# Patient Record
Sex: Male | Born: 1995 | Race: Black or African American | Hispanic: No | Marital: Single | Smoking: Never smoker
Health system: Southern US, Community
[De-identification: ages and names within clinical notes are randomized; demographics above are authoritative.]

## PROBLEM LIST (undated history)

## (undated) DIAGNOSIS — J45909 Unspecified asthma, uncomplicated: Secondary | ICD-10-CM

## (undated) HISTORY — PX: OTHER SURGICAL HISTORY: SHX169

## (undated) HISTORY — PX: TONSILLECTOMY AND ADENOIDECTOMY: SHX28

---
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
## 2015-09-29 ENCOUNTER — Encounter: Payer: Self-pay | Admitting: Internal Medicine

## 2015-09-29 ENCOUNTER — Ambulatory Visit (INDEPENDENT_AMBULATORY_CARE_PROVIDER_SITE_OTHER): Payer: Self-pay | Admitting: Internal Medicine

## 2015-09-29 VITALS — BP 126/62 | HR 57 | Temp 98.2°F | Ht 69.4 in | Wt 171.9 lb

## 2015-09-29 DIAGNOSIS — Z8709 Personal history of other diseases of the respiratory system: Secondary | ICD-10-CM

## 2015-09-29 DIAGNOSIS — Z0000 Encounter for general adult medical examination without abnormal findings: Secondary | ICD-10-CM | POA: Insufficient documentation

## 2015-09-29 NOTE — Patient Instructions (Signed)
Nice meeting you.  I am glad you are living a healthy life.  Continue to eat healthy, be active, and be safe.   Follow every year for check up.

## 2015-09-29 NOTE — Progress Notes (Signed)
   Subjective:    Patient ID: Taylor Estes, male    DOB: March 11, 1996, 20 y.o.   MRN: [id number]  HPI  20 yo male with hx of childhood asthma, no other medical history presents to get a physical for college.   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 Plans to study computer science. Denies any symptoms currently. Denies any depressive mood.   PMH: Denies any medical problems in the past.  Past surgical hx: Had a meniscus tear on right knee 2 years ago, and T&A removal when he was 664-20 years old.  Social hx: does not smoke, does not drink, no drug use. Single, not sexually active.  Family hx: mother has diabetes, no heart disease, no HTN, no cancer.  Patient brought immunization paper to be filled out but did not bring any records. Asked him to try to see if his ped office or his parents would have the records.   Review of Systems  Constitutional: Negative for fever, chills and fatigue.  HENT: Negative for congestion, rhinorrhea and sore throat.   Eyes: Negative for photophobia and visual disturbance.  Respiratory: Negative for chest tightness, shortness of breath and wheezing.   Cardiovascular: Negative for chest pain, palpitations and leg swelling.  Gastrointestinal: Negative for abdominal pain and abdominal distention.  Genitourinary: Negative for dysuria and hematuria.  Musculoskeletal: Negative for back pain and arthralgias.  Neurological: Negative for dizziness, seizures and numbness.       Objective:   Physical Exam  Constitutional: He is oriented to person, place, and time. He appears well-developed and well-nourished. No distress.  HENT:  Head: Normocephalic and atraumatic.  Eyes: Conjunctivae and EOM are normal. Pupils are equal, round, and reactive to light.  Neck: Normal range of motion.  Cardiovascular: Regular rhythm and normal heart sounds.  Exam reveals no gallop and no friction rub.   No murmur heard. Bradycardic   Pulmonary/Chest:  Effort normal and breath sounds normal. No respiratory distress. He has no wheezes.  Abdominal: Soft. Bowel sounds are normal. He exhibits no distension. There is no tenderness.  Musculoskeletal: Normal range of motion. He exhibits no edema or tenderness.  Neurological: He is alert and oriented to person, place, and time. No cranial nerve deficit.  Skin: He is not diaphoretic.  Psychiatric: He has a normal mood and affect.     Filed Vitals:   09/29/15 0925  BP: 126/62  Pulse: 57  Temp: 98.2 F (36.8 C)        Assessment & Plan:  See problem based a&p.

## 2015-09-29 NOTE — Assessment & Plan Note (Addendum)
Patient is a very healthy young man without any medical problems other than childhood asthma. No current complaints. Denies smoking, alcohol, drugs. Not sexually active.  Encouraged safe sex practices when he becomes sexually active, seat belt, alcohol, drug, and smoking avoidance. Asked him to get his immunization records to either us or have his ped office complete the form.  Follow up annually for wellness visit.

## 2015-10-02 NOTE — Progress Notes (Signed)
Internal Medicine Clinic Attending  I saw and evaluated the patient.  I personally confirmed the key portions of the history and exam documented by Dr. Ahmed and I reviewed pertinent patient test results.  The assessment, diagnosis, and plan were formulated together and I agree with the documentation in the resident's note.   

## 2023-03-03 ENCOUNTER — Other Ambulatory Visit: Payer: Self-pay

## 2023-03-03 ENCOUNTER — Encounter: Payer: Self-pay | Admitting: Emergency Medicine

## 2023-03-03 ENCOUNTER — Ambulatory Visit
Admission: EM | Admit: 2023-03-03 | Discharge: 2023-03-03 | Disposition: A | Discharge status: Home / Self Care | Payer: 59 | Attending: Family Medicine | Admitting: Family Medicine

## 2023-03-03 DIAGNOSIS — U071 COVID-19: Secondary | ICD-10-CM | POA: Diagnosis not present

## 2023-03-03 LAB — POC COVID19/FLU A&B COMBO
Covid Antigen, POC: POSITIVE — AB
Influenza A Antigen, POC: NEGATIVE
Influenza B Antigen, POC: NEGATIVE

## 2023-03-03 LAB — POCT RAPID STREP A (OFFICE): Rapid Strep A Screen: NEGATIVE

## 2023-03-03 NOTE — ED Provider Notes (Signed)
[facility]    CSN: [id number] Arrival date & time: 03/03/23  1024      History   Chief Complaint Chief Complaint  Patient presents with   Generalized Body Aches   Sore Throat    HPI Taylor Estes is a 27 y.o. male.    Sore Throat Associated symptoms include shortness of breath.  Patient is here for body aches, chills, night sweats, cough, sore throat.  He did have a headache which is improved. Symptoms started 3 days ago.  He has been feeling hot/cold, but has not taken his temp per se.  Taken nyquil.  No known sick contacts.  No home testing done.  No wheezing, slight sob.  He does have asthma, but does not have an inhaler.        Past Medical History:  Diagnosis Date   Asthma     Patient Active Problem List   Diagnosis Date Noted   Health care maintenance 09/29/2015    Past Surgical History:  Procedure Laterality Date   meniscus tear     TONSILLECTOMY AND ADENOIDECTOMY     toddler        Home Medications    Prior to Admission medications   Medication Sig Start Date End Date Taking? Authorizing Provider  albuterol (PROVENTIL HFA;VENTOLIN HFA) 108 (90 BASE) MCG/ACT inhaler Inhale 2 puffs into the lungs every 6 (six) hours as needed. For shortness of breath    [provider]    Family History Family History  Problem Relation Age of Onset   Diabetes Mother     Social History Social History   Tobacco Use   Smoking status: Never  Substance Use Topics   Alcohol use: No    Alcohol/week: 0.0 standard drinks of alcohol   Drug use: No     Allergies   Patient has no known allergies.   Review of Systems Review of Systems  Constitutional:  Positive for chills and fatigue.  HENT:  Positive for congestion, rhinorrhea and sore throat.   Respiratory:  Positive for cough and shortness of breath. Negative for wheezing.   Cardiovascular: Negative.   Gastrointestinal: Negative.   Genitourinary: Negative.    Musculoskeletal: Negative.   Psychiatric/Behavioral: Negative.       Physical Exam Triage Vital Signs ED Triage Vitals  Encounter Vitals Group     BP      Systolic BP Percentile      Diastolic BP Percentile      Pulse      Resp      Temp      Temp src      SpO2      Weight      Height      Head Circumference      Peak Flow      Pain Score      Pain Loc      Pain Education      Exclude from Growth Chart    No data found.  Updated Vital Signs There were no vitals taken for this visit.  Visual Acuity Right Eye Distance:   Left Eye Distance:   Bilateral Distance:    Right Eye Near:   Left Eye Near:    Bilateral Near:     Physical Exam Constitutional:      General: He is not in acute distress.    Appearance: He is well-developed. He is not ill-appearing.  HENT:     Nose: No congestion or rhinorrhea.  Mouth/Throat:     Mouth: Mucous membranes are moist.     Pharynx: No pharyngeal swelling, oropharyngeal exudate or posterior oropharyngeal erythema.  Cardiovascular:     Rate and Rhythm: Normal rate and regular rhythm.     Heart sounds: Normal heart sounds.  Pulmonary:     Effort: Pulmonary effort is normal.  Musculoskeletal:     Cervical back: Normal range of motion and neck supple.  Lymphadenopathy:     Cervical: No cervical adenopathy.  Skin:    General: Skin is warm.  Neurological:     General: No focal deficit present.     Mental Status: He is alert.  Psychiatric:        Mood and Affect: Mood normal.      UC Treatments / Results  Labs (all labs ordered are listed, but only abnormal results are displayed) Labs Reviewed  POC COVID19/FLU A&B COMBO - Abnormal; Notable for the following components:      Result Value   Covid Antigen, POC Positive (*)    All other components within normal limits  POCT RAPID STREP A (OFFICE)    EKG   Radiology No results found.  Procedures Procedures (including critical care time)  Medications Ordered  in UC Medications - No data to display  Initial Impression / Assessment and Plan / UC Course  I have reviewed the triage vital signs and the nursing notes.  Pertinent labs & imaging results that were available during my care of the patient were reviewed by me and considered in my medical decision making (see chart for details).   Final Clinical Impressions(s) / UC Diagnoses   Final diagnoses:  COVID     Discharge Instructions      You were seen today for upper respiratory symptoms.  Your covid test was positive today.  You can continue over the counter medications for your symptoms.  I recommend you get plenty of rest and fluids.  I have given you a note for work.  Stay home until you are feeling improved and fever free.     ED Prescriptions   None    PDMP not reviewed this encounter.   Jannifer Franklin, MD 03/03/23 [phone]

## 2023-03-03 NOTE — ED Triage Notes (Signed)
Pt here for body aches and chills with sore throat and cough x 5 days

## 2023-03-03 NOTE — Discharge Instructions (Signed)
You were seen today for upper respiratory symptoms.  Your covid test was positive today.  You can continue over the counter medications for your symptoms.  I recommend you get plenty of rest and fluids.  I have given you a note for work.  Stay home until you are feeling improved and fever free.

## 2023-09-03 ENCOUNTER — Ambulatory Visit
Admission: EM | Admit: 2023-09-03 | Discharge: 2023-09-03 | Disposition: A | Discharge status: Home / Self Care | Payer: Self-pay | Attending: Internal Medicine | Admitting: Internal Medicine

## 2023-09-03 DIAGNOSIS — L02412 Cutaneous abscess of left axilla: Secondary | ICD-10-CM

## 2023-09-03 MED ORDER — DOXYCYCLINE HYCLATE 100 MG PO CAPS: 100.0000 mg | ORAL_CAPSULE | Freq: Two times a day (BID) | ORAL | 0 refills | Status: AC

## 2023-09-03 NOTE — Discharge Instructions (Signed)
 We drained your abscess today in the clinic and left open to continue to drain.  Continue to use warm compresses to the wound to further encourage drainage from the wound.  You may do this in the shower as well with gentle compresses to the area to allow more infected material to drain.   Take antibiotic as prescribed to treat infection to the abscess.   Change your dressings frequently as the wound heals.   You may take over the counter pain medicines as needed for pain once the numbing wears off.  If you notice any worsening signs of infection such as redness, swelling, fever, or worsening drainage, please return to urgent care for reevaluation.

## 2023-09-03 NOTE — ED Triage Notes (Signed)
"  I believe I have a cyst under my left arm/pit". "I did have more than 1 but the first popped and drained this is another one in the same location". No fever.

## 2023-09-03 NOTE — ED Provider Notes (Signed)
 [facility]    CSN: [id number] Arrival date & time: 09/03/23  0805      History   Chief Complaint Chief Complaint  Patient presents with   Skin Problem    HPI Taylor Estes is a 28 y.o. male.   Patient presents to care for evaluation of 2 abscesses to the left axilla that he first noticed 3-4 days ago.  Superior abscess is smaller and began to drain 2 to 3 days ago.  Inferior abscess has grown significantly in size, pain, and redness over the last 3 to 4 days and started to drain slightly yesterday.  Drainage from both abscesses is described as bloody and purulent.  He has never had abscesses to this area in the past and denies recent trauma/injury to the axilla.  Denies recent fever, chills, body aches, or nausea/vomiting.  No history of abscesses requiring incision and drainage.  No recent antibiotic or steroid use reported.  No history of immunosuppression.  He has been using over-the-counter medications without relief of pain.     Past Medical History:  Diagnosis Date   Asthma     Patient Active Problem List   Diagnosis Date Noted   Health care maintenance 09/29/2015    Past Surgical History:  Procedure Laterality Date   meniscus tear     TONSILLECTOMY AND ADENOIDECTOMY     toddler        Home Medications    Prior to Admission medications   Medication Sig Start Date End Date Taking? Authorizing Provider  doxycycline (VIBRAMYCIN) 100 MG capsule Take 1 capsule (100 mg total) by mouth 2 (two) times daily for 7 days. 09/03/23 09/10/23 Yes Starlene Eaton, FNP  albuterol (PROVENTIL HFA;VENTOLIN HFA) 108 (90 BASE) MCG/ACT inhaler Inhale 2 puffs into the lungs every 6 (six) hours as needed. For shortness of breath    [provider]    Family History Family History  Problem Relation Age of Onset   Diabetes Mother     Social History Social History   Tobacco Use   Smoking status: Never   Smokeless tobacco: Never  Vaping Use    Vaping status: Never Used  Substance Use Topics   Alcohol use: No    Alcohol/week: 0.0 standard drinks of alcohol   Drug use: No     Allergies   Patient has no known allergies.   Review of Systems Review of Systems Per HPI  Physical Exam Triage Vital Signs ED Triage Vitals  Encounter Vitals Group     BP 09/03/23 0841 110/70     Systolic BP Percentile --      Diastolic BP Percentile --      Pulse Rate 09/03/23 0841 77     Resp 09/03/23 0841 18     Temp 09/03/23 0841 98.4 F (36.9 C)     Temp Source 09/03/23 0841 Oral     SpO2 09/03/23 0841 98 %     Weight 09/03/23 0839 170 lb (77.1 kg)     Height 09/03/23 0839 5' 9.5" (1.765 m)     Head Circumference --      Peak Flow --      Pain Score 09/03/23 0837 7     Pain Loc --      Pain Education --      Exclude from Growth Chart --    No data found.  Updated Vital Signs BP 110/70 (BP Location: Right Arm)   Pulse 77   Temp  98.4 F (36.9 C) (Oral)   Resp 18   Ht 5' 9.5" (1.765 m)   Wt 170 lb (77.1 kg)   SpO2 98%   BMI 24.74 kg/m   Visual Acuity Right Eye Distance:   Left Eye Distance:   Bilateral Distance:    Right Eye Near:   Left Eye Near:    Bilateral Near:     Physical Exam Vitals and nursing note reviewed.  Constitutional:      Appearance: He is not ill-appearing or toxic-appearing.  HENT:     Head: Normocephalic and atraumatic.     Right Ear: Hearing and external ear normal.     Left Ear: Hearing and external ear normal.     Nose: Nose normal.     Mouth/Throat:     Lips: Pink.  Eyes:     General: Lids are normal. Vision grossly intact. Gaze aligned appropriately.     Extraocular Movements: Extraocular movements intact.     Conjunctiva/sclera: Conjunctivae normal.  Pulmonary:     Effort: Pulmonary effort is normal.  Musculoskeletal:     Cervical back: Neck supple.  Lymphadenopathy:     Upper Body:     Right upper body: No axillary adenopathy.     Left upper body: Axillary adenopathy  present.  Skin:    General: Skin is warm and dry.     Capillary Refill: Capillary refill takes less than 2 seconds.     Findings: Abscess present. No rash.          Comments: Superior abscess to the left axilla is approximately 3 cm x 1 cm and is indurated. Inferior abscess is approximately 4 to 5 cm x 2 cm and is mildly fluctuant to palpation, very tender.  Actively draining scant pus/blood.  Neurological:     General: No focal deficit present.     Mental Status: He is alert and oriented to person, place, and time. Mental status is at baseline.     Cranial Nerves: No dysarthria or facial asymmetry.  Psychiatric:        Mood and Affect: Mood normal.        Speech: Speech normal.        Behavior: Behavior normal.        Thought Content: Thought content normal.        Judgment: Judgment normal.      UC Treatments / Results  Labs (all labs ordered are listed, but only abnormal results are displayed) Labs Reviewed - No data to display  EKG   Radiology No results found.  Procedures Incision and Drainage  Date/Time: 09/03/2023 9:59 AM  Performed by: Starlene Eaton, FNP Authorized by: Starlene Eaton, FNP   Consent:    Consent obtained:  Verbal   Consent given by:  Patient   Risks, benefits, and alternatives were discussed: yes     Risks discussed:  Bleeding, damage to other organs, incomplete drainage, infection and pain   Alternatives discussed:  No treatment Location:    Type:  Abscess   Size:  4 cm x 2 cm   Location:  Upper extremity   Upper extremity location:  Arm   Arm location:  L upper arm Pre-procedure details:    Skin preparation:  Povidone-iodine Sedation:    Sedation type:  None Anesthesia:    Anesthesia method:  Local infiltration   Local anesthetic:  Lidocaine 1% w/o epi Procedure type:    Complexity:  Complex Procedure details:    Incision types:  Stab incision   Incision depth:  Dermal   Wound management:  Probed and deloculated  and irrigated with saline   Drainage:  Bloody and purulent   Drainage amount:  Scant   Wound treatment:  Wound left open   Packing materials:  None Post-procedure details:    Procedure completion:  Tolerated well, no immediate complications  (including critical care time)  Medications Ordered in UC Medications - No data to display  Initial Impression / Assessment and Plan / UC Course  I have reviewed the triage vital signs and the nursing notes.  Pertinent labs & imaging results that were available during my care of the patient were reviewed by me and considered in my medical decision making (see chart for details).   1.  Abscess of left axilla See incision and drainage note above for further detail regarding incision and drainage procedure, patient tolerated well.   Wound cleansed and dressed in clinic. Wound care discussed (warm compresses, dressing changes, etc).  Doxycycline antibiotic ordered and supportive care discussed/outlined in AVS.  Counseled patient on potential for adverse effects with medications prescribed/recommended today, strict ER and return-to-clinic precautions discussed, patient verbalized understanding.    Final Clinical Impressions(s) / UC Diagnoses   Final diagnoses:  Abscess of left axilla     Discharge Instructions      We drained your abscess today in the clinic and left open to continue to drain.  Continue to use warm compresses to the wound to further encourage drainage from the wound.  You may do this in the shower as well with gentle compresses to the area to allow more infected material to drain.   Take antibiotic as prescribed to treat infection to the abscess.   Change your dressings frequently as the wound heals.   You may take over the counter pain medicines as needed for pain once the numbing wears off.  If you notice any worsening signs of infection such as redness, swelling, fever, or worsening drainage, please return to urgent care  for reevaluation.    ED Prescriptions     Medication Sig Dispense Auth. Provider   doxycycline (VIBRAMYCIN) 100 MG capsule Take 1 capsule (100 mg total) by mouth 2 (two) times daily for 7 days. 14 capsule Starlene Eaton, FNP      PDMP not reviewed this encounter.   Starlene Eaton, FNP 09/03/23 1000

## 2023-10-23 ENCOUNTER — Ambulatory Visit: Payer: Self-pay

## 2023-10-23 ENCOUNTER — Ambulatory Visit
Admission: EM | Admit: 2023-10-23 | Discharge: 2023-10-23 | Disposition: A | Discharge status: Home / Self Care | Payer: Self-pay | Attending: Emergency Medicine | Admitting: Emergency Medicine

## 2023-10-23 DIAGNOSIS — L02415 Cutaneous abscess of right lower limb: Secondary | ICD-10-CM

## 2023-10-23 MED ORDER — MUPIROCIN 2 % EX OINT: 1.0000 | TOPICAL_OINTMENT | Freq: Two times a day (BID) | CUTANEOUS | 0 refills | Status: AC

## 2023-10-23 MED ORDER — IBUPROFEN 800 MG PO TABS: 800.0000 mg | ORAL_TABLET | Freq: Three times a day (TID) | ORAL | 0 refills | Status: AC

## 2023-10-23 MED ORDER — SULFAMETHOXAZOLE-TRIMETHOPRIM 800-160 MG PO TABS: 1.0000 | ORAL_TABLET | Freq: Two times a day (BID) | ORAL | 0 refills | Status: AC

## 2023-10-23 NOTE — Discharge Instructions (Addendum)
 Take the Bactrim twice daily with food until finished. Warm compress with water and Hibiclens twice daily for 10-15 min at a time.  Ibuprofen 800 mg every 8 hours can help with pain and swelling. Symptoms should improve in the next few days, if not please return to clinic for re-evaluation.

## 2023-10-23 NOTE — ED Provider Notes (Signed)
 [facility]    CSN: [id number] Arrival date & time: 10/23/23  0933      History   Chief Complaint Chief Complaint  Patient presents with   Abscess    HPI Taylor Estes is a 28 y.o. male.   Patient presents to clinic for concern of an abscess to his right anterior thigh that has been present for the past 3 or 4 days.  Does have a history of abscesses requiring incision and drainage to the axilla.  Has not really done any interventions for this abscess has not taken any medications.  Did notice it had some scabbing and some blood.  Unsure if it started out as a bug bite or ingrown hair, thinks it just popped.  The history is provided by the patient and medical records.  Abscess   Past Medical History:  Diagnosis Date   Asthma     Patient Active Problem List   Diagnosis Date Noted   Health care maintenance 09/29/2015    Past Surgical History:  Procedure Laterality Date   meniscus tear     TONSILLECTOMY AND ADENOIDECTOMY     toddler        Home Medications    Prior to Admission medications   Medication Sig Start Date End Date Taking? Authorizing Provider  ibuprofen (ADVIL) 800 MG tablet Take 1 tablet (800 mg total) by mouth 3 (three) times daily. 10/23/23  Yes Deloise Marchant  N, FNP  mupirocin ointment (BACTROBAN) 2 % Apply 1 Application topically 2 (two) times daily. 10/23/23  Yes Donyale Berthold  N, FNP  sulfamethoxazole-trimethoprim (BACTRIM DS) 800-160 MG tablet Take 1 tablet by mouth 2 (two) times daily for 7 days. 10/23/23 10/30/23 Yes Keyon Winnick  N, FNP  albuterol (PROVENTIL HFA;VENTOLIN HFA) 108 (90 BASE) MCG/ACT inhaler Inhale 2 puffs into the lungs every 6 (six) hours as needed. For shortness of breath    [provider]    Family History Family History  Problem Relation Age of Onset   Diabetes Mother     Social History Social History   Tobacco Use   Smoking status: Never   Smokeless tobacco: Never  Vaping Use    Vaping status: Never Used  Substance Use Topics   Alcohol use: No    Alcohol/week: 0.0 standard drinks of alcohol   Drug use: No     Allergies   Patient has no known allergies.   Review of Systems Review of Systems  Per HPI  Physical Exam Triage Vital Signs ED Triage Vitals [10/23/23 0942]  Encounter Vitals Group     BP 109/75     Girls Systolic BP Percentile      Girls Diastolic BP Percentile      Boys Systolic BP Percentile      Boys Diastolic BP Percentile      Pulse Rate 91     Resp 16     Temp 98.2 F (36.8 C)     Temp Source Oral     SpO2 98 %     Weight      Height      Head Circumference      Peak Flow      Pain Score 9     Pain Loc      Pain Education      Exclude from Growth Chart    No data found.  Updated Vital Signs BP 109/75 (BP Location: Left Arm)   Pulse 91   Temp 98.2 F (36.8 C) (Oral)  Resp 16   SpO2 98%   Visual Acuity Right Eye Distance:   Left Eye Distance:   Bilateral Distance:    Right Eye Near:   Left Eye Near:    Bilateral Near:     Physical Exam Vitals and nursing note reviewed.  Constitutional:      Appearance: Normal appearance.  HENT:     Head: Normocephalic and atraumatic.     Right Ear: External ear normal.     Left Ear: External ear normal.     Nose: Nose normal.     Mouth/Throat:     Mouth: Mucous membranes are moist.  Eyes:     Conjunctiva/sclera: Conjunctivae normal.  Cardiovascular:     Rate and Rhythm: Normal rate.  Pulmonary:     Effort: Pulmonary effort is normal. No respiratory distress.  Musculoskeletal:        General: Normal range of motion.  Skin:    General: Skin is warm and dry.     Findings: Abscess present.      Neurological:     General: No focal deficit present.     Mental Status: He is alert.  Psychiatric:        Mood and Affect: Mood normal.        Behavior: Behavior is cooperative.      UC Treatments / Results  Labs (all labs ordered are listed, but only abnormal  results are displayed) Labs Reviewed - No data to display  EKG   Radiology No results found.  Procedures Procedures (including critical care time)  Medications Ordered in UC Medications - No data to display  Initial Impression / Assessment and Plan / UC Course  I have reviewed the triage vital signs and the nursing notes.  Pertinent labs & imaging results that were available during my care of the patient were reviewed by me and considered in my medical decision making (see chart for details).  Vitals in triage reviewed, patient is hemodynamically stable.  Large abscess to the right anterior thigh that is approximately 7 cm x 6 cm and indurated.  Will treat with Bactrim for MRSA coverage.  Wound care discussed.  Return to clinic in 2 to 3 days if no improvement or if area becomes fluctuant, incision and drainage can be considered then.  Pain management discussed.  Plan of care, follow-up care return precautions given, no questions at this time.     Final Clinical Impressions(s) / UC Diagnoses   Final diagnoses:  Abscess of right leg     Discharge Instructions      Take the Bactrim twice daily with food until finished. Warm compress with water and Hibiclens twice daily for 10-15 min at a time.  Ibuprofen 800 mg every 8 hours can help with pain and swelling. Symptoms should improve in the next few days, if not please return to clinic for re-evaluation.     ED Prescriptions     Medication Sig Dispense Auth. Provider   sulfamethoxazole-trimethoprim (BACTRIM DS) 800-160 MG tablet Take 1 tablet by mouth 2 (two) times daily for 7 days. 14 tablet Jari Dipasquale  N, FNP   ibuprofen (ADVIL) 800 MG tablet Take 1 tablet (800 mg total) by mouth 3 (three) times daily. 30 tablet Dreama, Casia Corti  N, FNP   mupirocin ointment (BACTROBAN) 2 % Apply 1 Application topically 2 (two) times daily. 22 g Dreama, Amrit Erck  N, FNP      PDMP not reviewed this encounter.   Dreama, Yoon Barca  N,  FNP 10/23/23 1011

## 2023-10-23 NOTE — ED Triage Notes (Signed)
 Pt states cyst to right thigh for the past 3 days.

## 2024-04-25 ENCOUNTER — Other Ambulatory Visit: Payer: Self-pay

## 2024-04-25 ENCOUNTER — Inpatient Hospital Stay: Admission: RE | Admit: 2024-04-25 | Discharge: 2024-04-25 | Payer: Self-pay

## 2024-04-25 ENCOUNTER — Ambulatory Visit
Admission: RE | Admit: 2024-04-25 | Discharge: 2024-04-25 | Disposition: A | Discharge status: Home / Self Care | Payer: Self-pay | Source: Ambulatory Visit | Attending: Student | Admitting: Student

## 2024-04-25 ENCOUNTER — Encounter: Payer: Self-pay | Admitting: Emergency Medicine

## 2024-04-25 DIAGNOSIS — Z113 Encounter for screening for infections with a predominantly sexual mode of transmission: Secondary | ICD-10-CM | POA: Insufficient documentation

## 2024-04-25 NOTE — ED Triage Notes (Signed)
Pt here requesting STD testing; denies sx  

## 2024-04-25 NOTE — Discharge Instructions (Addendum)
-   We are testing for gonorrhea, chlamydia, trichomonas, HIV and syphilis -We will call with any positive results in about 3 business days and can send treatment if necessary. -We will not call with negative lab results. -Lab results will automatically go to your MyChart.

## 2024-04-25 NOTE — ED Provider Notes (Signed)
 " [facility]    CSN: [id number] Arrival date & time: 04/25/24  0945      History   Chief Complaint Chief Complaint  Patient presents with   Exposure to STD    HPI Taylor Estes is a 29 y.o. male presenting for STI screen - asymptomatic. Denies risk.  HPI  Past Medical History:  Diagnosis Date   Asthma     Patient Active Problem List   Diagnosis Date Noted   Health care maintenance 09/29/2015    Past Surgical History:  Procedure Laterality Date   meniscus tear     TONSILLECTOMY AND ADENOIDECTOMY     toddler        Home Medications    Prior to Admission medications  Medication Sig Start Date End Date Taking? Authorizing Provider  albuterol (PROVENTIL HFA;VENTOLIN HFA) 108 (90 BASE) MCG/ACT inhaler Inhale 2 puffs into the lungs every 6 (six) hours as needed. For shortness of breath    [provider]  ibuprofen  (ADVIL ) 800 MG tablet Take 1 tablet (800 mg total) by mouth 3 (three) times daily. 10/23/23   Dreama, [place]  N, FNP  mupirocin  ointment (BACTROBAN ) 2 % Apply 1 Application topically 2 (two) times daily. 10/23/23   Dreama, [place]  N, FNP    Family History Family History  Problem Relation Age of Onset   Diabetes Mother     Social History Social History[1]   Allergies   Patient has no known allergies.   Review of Systems Review of Systems  Constitutional:  Negative for chills and fever.  HENT:  Negative for sore throat.   Eyes:  Negative for pain and redness.  Respiratory:  Negative for shortness of breath.   Cardiovascular:  Negative for chest pain.  Gastrointestinal:  Negative for abdominal pain, diarrhea, nausea and vomiting.  Genitourinary:  Negative for decreased urine volume, difficulty urinating, dysuria, flank pain, frequency, genital sores, hematuria and urgency.  Musculoskeletal:  Negative for back pain.  Skin:  Negative for rash.     Physical Exam Triage Vital Signs ED Triage Vitals [04/25/24 1006]   Encounter Vitals Group     BP 124/76     Girls Systolic BP Percentile      Girls Diastolic BP Percentile      Boys Systolic BP Percentile      Boys Diastolic BP Percentile      Pulse Rate 62     Resp 18     Temp 98.1 F (36.7 C)     Temp Source Oral     SpO2 95 %     Weight      Height      Head Circumference      Peak Flow      Pain Score 0     Pain Loc      Pain Education      Exclude from Growth Chart    No data found.  Updated Vital Signs BP 124/76 (BP Location: Left Arm)   Pulse 62   Temp 98.1 F (36.7 C) (Oral)   Resp 18   SpO2 95%   Visual Acuity Right Eye Distance:   Left Eye Distance:   Bilateral Distance:    Right Eye Near:   Left Eye Near:    Bilateral Near:     Physical Exam Vitals reviewed.  Constitutional:      General: He is not in acute distress.    Appearance: Normal appearance. He is not ill-appearing or diaphoretic.  HENT:  Head: Normocephalic and atraumatic.  Cardiovascular:     Rate and Rhythm: Normal rate and regular rhythm.     Heart sounds: Normal heart sounds.  Pulmonary:     Effort: Pulmonary effort is normal.     Breath sounds: Normal breath sounds.  Skin:    General: Skin is warm.  Neurological:     General: No focal deficit present.     Mental Status: He is alert and oriented to person, place, and time.  Psychiatric:        Mood and Affect: Mood normal.        Behavior: Behavior normal.        Thought Content: Thought content normal.        Judgment: Judgment normal.      UC Treatments / Results  Labs (all labs ordered are listed, but only abnormal results are displayed) Labs Reviewed  HIV ANTIBODY (ROUTINE TESTING W REFLEX)  SYPHILIS: RPR W/REFLEX TO RPR TITER AND TREPONEMAL ANTIBODIES, TRADITIONAL SCREENING AND DIAGNOSIS ALGORITHM  CYTOLOGY, (ORAL, ANAL, URETHRAL) ANCILLARY ONLY    EKG   Radiology No results found.  Procedures Procedures (including critical care time)  Medications Ordered in  UC Medications - No data to display  Initial Impression / Assessment and Plan / UC Course  I have reviewed the triage vital signs and the nursing notes.  Pertinent labs & imaging results that were available during my care of the patient were reviewed by me and considered in my medical decision making (see chart for details).     Patient is a pleasant 29 y.o. male presenting with STI screen-asymptomatic. The patient is afebrile and nontachycardic.  Antipyretic has not been administered today. He is asymptomatic.   Self swab collected for gonorrhea, chlamydia, trichomonas.  Also checking for HIV and syphilis.  Final Clinical Impressions(s) / UC Diagnoses   Final diagnoses:  Routine screening for STI (sexually transmitted infection)     Discharge Instructions      - We are testing for gonorrhea, chlamydia, trichomonas, HIV and syphilis -We will call with any positive results in about 3 business days and can send treatment if necessary. -We will not call with negative lab results. -Lab results will automatically go to your MyChart.     ED Prescriptions   None    PDMP not reviewed this encounter.     [1]  Social History Tobacco Use   Smoking status: Never   Smokeless tobacco: Never  Vaping Use   Vaping status: Never Used  Substance Use Topics   Alcohol use: No    Alcohol/week: 0.0 standard drinks of alcohol   Drug use: No     Arlyss Leita BRAVO, PA-C 04/25/24 1022  "

## 2024-04-26 LAB — CYTOLOGY, (ORAL, ANAL, URETHRAL) ANCILLARY ONLY
Chlamydia: NEGATIVE
Comment: NEGATIVE
Comment: NEGATIVE
Comment: NORMAL
Neisseria Gonorrhea: NEGATIVE
Trichomonas: NEGATIVE

## 2024-04-27 LAB — SYPHILIS: RPR W/REFLEX TO RPR TITER AND TREPONEMAL ANTIBODIES, TRADITIONAL SCREENING AND DIAGNOSIS ALGORITHM: RPR Ser Ql: NONREACTIVE

## 2024-04-27 LAB — HIV ANTIBODY (ROUTINE TESTING W REFLEX): HIV Screen 4th Generation wRfx: NONREACTIVE

## 2024-05-01 ENCOUNTER — Inpatient Hospital Stay: Admission: RE | Admit: 2024-05-01 | Discharge: 2024-05-01

## 2024-05-01 VITALS — BP 116/75 | HR 75 | Temp 98.3°F | Resp 16

## 2024-05-01 DIAGNOSIS — Z202 Contact with and (suspected) exposure to infections with a predominantly sexual mode of transmission: Secondary | ICD-10-CM | POA: Diagnosis present

## 2024-05-01 MED ORDER — CEFTRIAXONE SODIUM 500 MG IJ SOLR
500.0000 mg | INTRAMUSCULAR | Status: DC
  Administered 2024-05-01: 500 mg via INTRAMUSCULAR

## 2024-05-01 MED ORDER — DOXYCYCLINE HYCLATE 100 MG PO CAPS: 100.0000 mg | ORAL_CAPSULE | Freq: Two times a day (BID) | ORAL | 0 refills | Status: AC

## 2024-05-01 NOTE — ED Triage Notes (Signed)
 Pt present exposure to Std, pt states having penis discharge, symptom started Friday.

## 2024-05-01 NOTE — ED Provider Notes (Signed)
 " [facility]    CSN: [id number] Arrival date & time: 05/01/24  1350      History   Chief Complaint Chief Complaint  Patient presents with   SEXUALLY TRANSMITTED DISEASE    Got a STD test done Sunday Jan 4, results came back negative on everything. Have intercourse with an individual on Jan 8, day later, starting to have symptoms of possibly Chlamydia. - Entered by patient    HPI Taylor Estes is a 29 y.o. male.   Pt presents today due to penile discharge that he noticed on Friday as well as groin soreness like he had been working out but he has not. Pt denies dysuria, frequency, penile, or testicular pain. Pt states that he is sexually active with women and last encounter was Thursday. Pt states that he was recently tested for STI and he came back neg.   The history is provided by the patient.    Past Medical History:  Diagnosis Date   Asthma     Patient Active Problem List   Diagnosis Date Noted   Health care maintenance 09/29/2015    Past Surgical History:  Procedure Laterality Date   meniscus tear     TONSILLECTOMY AND ADENOIDECTOMY     toddler        Home Medications    Prior to Admission medications  Medication Sig Start Date End Date Taking? Authorizing Provider  albuterol (PROVENTIL HFA;VENTOLIN HFA) 108 (90 BASE) MCG/ACT inhaler Inhale 2 puffs into the lungs every 6 (six) hours as needed. For shortness of breath    [provider]  ibuprofen  (ADVIL ) 800 MG tablet Take 1 tablet (800 mg total) by mouth 3 (three) times daily. 10/23/23   Dreama, [place]  N, FNP  mupirocin  ointment (BACTROBAN ) 2 % Apply 1 Application topically 2 (two) times daily. 10/23/23   Dreama, [place]  N, FNP    Family History Family History  Problem Relation Age of Onset   Diabetes Mother     Social History Social History[1]   Allergies   Patient has no known allergies.   Review of Systems Review of Systems   Physical Exam Triage Vital Signs ED  Triage Vitals  Encounter Vitals Group     BP 05/01/24 1416 116/75     Girls Systolic BP Percentile --      Girls Diastolic BP Percentile --      Boys Systolic BP Percentile --      Boys Diastolic BP Percentile --      Pulse Rate 05/01/24 1416 75     Resp 05/01/24 1416 16     Temp 05/01/24 1416 98.3 F (36.8 C)     Temp Source 05/01/24 1416 Oral     SpO2 05/01/24 1416 95 %     Weight --      Height --      Head Circumference --      Peak Flow --      Pain Score 05/01/24 1414 0     Pain Loc --      Pain Education --      Exclude from Growth Chart --    No data found.  Updated Vital Signs BP 116/75 (BP Location: Left Arm)   Pulse 75   Temp 98.3 F (36.8 C) (Oral)   Resp 16   SpO2 95%   Visual Acuity Right Eye Distance:   Left Eye Distance:   Bilateral Distance:    Right Eye Near:   Left Eye  Near:    Bilateral Near:     Physical Exam Vitals and nursing note reviewed.  Constitutional:      General: He is not in acute distress.    Appearance: Normal appearance. He is not ill-appearing, toxic-appearing or diaphoretic.  HENT:     Mouth/Throat:     Mouth: Mucous membranes are moist.     Pharynx: Oropharynx is clear. No oropharyngeal exudate or posterior oropharyngeal erythema.  Eyes:     General: No scleral icterus. Cardiovascular:     Rate and Rhythm: Normal rate and regular rhythm.     Heart sounds: Normal heart sounds.  Pulmonary:     Effort: Pulmonary effort is normal. No respiratory distress.     Breath sounds: Normal breath sounds. No wheezing or rhonchi.  Abdominal:     General: Abdomen is flat. Bowel sounds are normal.     Palpations: Abdomen is soft.     Tenderness: There is no abdominal tenderness. There is no right CVA tenderness or left CVA tenderness.  Skin:    General: Skin is warm.  Neurological:     Mental Status: He is alert and oriented to person, place, and time.  Psychiatric:        Mood and Affect: Mood normal.        Behavior: Behavior  normal.      UC Treatments / Results  Labs (all labs ordered are listed, but only abnormal results are displayed) Labs Reviewed  CYTOLOGY, (ORAL, ANAL, URETHRAL) ANCILLARY ONLY    EKG   Radiology No results found.  Procedures Procedures (including critical care time)  Medications Ordered in UC Medications  cefTRIAXone  (ROCEPHIN ) injection 500 mg (has no administration in time range)    Initial Impression / Assessment and Plan / UC Course  I have reviewed the triage vital signs and the nursing notes.  Pertinent labs & imaging results that were available during my care of the patient were reviewed by me and considered in my medical decision making (see chart for details).    Final Clinical Impressions(s) / UC Diagnoses   Final diagnoses:  None   Discharge Instructions   None    ED Prescriptions   None    PDMP not reviewed this encounter.    [1]  Social History Tobacco Use   Smoking status: Never   Smokeless tobacco: Never  Vaping Use   Vaping status: Never Used  Substance Use Topics   Alcohol use: No    Alcohol/week: 0.0 standard drinks of alcohol   Drug use: No     [name], PA-C 05/01/24 1444  "

## 2024-05-01 NOTE — Discharge Instructions (Signed)
 You were tested for STIs today. You should receive your results in 3-5 days. If you have not received a call from the office or see results in your mychart please call the clinic where you were seen. It is best if your refrain from sexual activity until you get results back. If positive you will need to refrain from sexual activity for 2 weeks and be sure to complete any antibiotics prescribed in their entirety. '

## 2024-05-03 LAB — CYTOLOGY, (ORAL, ANAL, URETHRAL) ANCILLARY ONLY
Chlamydia: NEGATIVE
Comment: NEGATIVE
Comment: NEGATIVE
Comment: NORMAL
Neisseria Gonorrhea: NEGATIVE
Trichomonas: NEGATIVE

## 2024-05-04 LAB — SYPHILIS: RPR W/REFLEX TO RPR TITER AND TREPONEMAL ANTIBODIES, TRADITIONAL SCREENING AND DIAGNOSIS ALGORITHM: RPR Ser Ql: NONREACTIVE

## 2024-05-04 LAB — HIV ANTIBODY (ROUTINE TESTING W REFLEX): HIV Screen 4th Generation wRfx: NONREACTIVE

## 2024-05-11 ENCOUNTER — Encounter: Payer: Self-pay | Admitting: Emergency Medicine

## 2024-05-11 ENCOUNTER — Ambulatory Visit: Admission: EM | Admit: 2024-05-11 | Discharge: 2024-05-11 | Disposition: A | Discharge status: Home / Self Care

## 2024-05-11 DIAGNOSIS — Z202 Contact with and (suspected) exposure to infections with a predominantly sexual mode of transmission: Secondary | ICD-10-CM | POA: Diagnosis not present

## 2024-05-11 NOTE — ED Triage Notes (Signed)
 Pt presents requesting STD testing including blood work. Pt reports he is not experiencing any sxs.. Last sexual encounter approx one week ago.   Pt reports he was here a week ago for a UTI and now wants to get a check up.

## 2024-05-11 NOTE — Discharge Instructions (Signed)
 Will send swab and blood off for STI testing, if you are positive for anything we will contact you

## 2024-05-11 NOTE — ED Provider Notes (Signed)
 " [facility]    CSN: [id number] Arrival date & time: 05/11/24  1003      History   Chief Complaint Chief Complaint  Patient presents with   Exposure to STD    HPI Taylor Estes is a 29 y.o. male.   Pt presents today for STI testing, denies symptoms.   The history is provided by the patient.  Exposure to STD    Past Medical History:  Diagnosis Date   Asthma     Patient Active Problem List   Diagnosis Date Noted   Health care maintenance 09/29/2015    Past Surgical History:  Procedure Laterality Date   meniscus tear     TONSILLECTOMY AND ADENOIDECTOMY     toddler        Home Medications    Prior to Admission medications  Medication Sig Start Date End Date Taking? Authorizing Provider  albuterol (PROVENTIL HFA;VENTOLIN HFA) 108 (90 BASE) MCG/ACT inhaler Inhale 2 puffs into the lungs every 6 (six) hours as needed. For shortness of breath    [provider]  ibuprofen  (ADVIL ) 800 MG tablet Take 1 tablet (800 mg total) by mouth 3 (three) times daily. 10/23/23   Ball, [place]  G, FNP  mupirocin  ointment (BACTROBAN ) 2 % Apply 1 Application topically 2 (two) times daily. 10/23/23   Mercer, [place]  G, FNP    Family History Family History  Problem Relation Age of Onset   Diabetes Mother     Social History Social History[1]   Allergies   Patient has no known allergies.   Review of Systems Review of Systems   Physical Exam Triage Vital Signs ED Triage Vitals [05/11/24 1104]  Encounter Vitals Group     BP 121/78     Girls Systolic BP Percentile      Girls Diastolic BP Percentile      Boys Systolic BP Percentile      Boys Diastolic BP Percentile      Pulse Rate 77     Resp 16     Temp 98.3 F (36.8 C)     Temp Source Oral     SpO2 98 %     Weight 169 lb 15.6 oz (77.1 kg)     Height      Head Circumference      Peak Flow      Pain Score 0     Pain Loc      Pain Education      Exclude from Growth Chart    No data  found.  Updated Vital Signs BP 121/78 (BP Location: Right Arm)   Pulse 77   Temp 98.3 F (36.8 C) (Oral)   Resp 16   Wt 169 lb 15.6 oz (77.1 kg)   SpO2 98%   BMI 24.74 kg/m   Visual Acuity Right Eye Distance:   Left Eye Distance:   Bilateral Distance:    Right Eye Near:   Left Eye Near:    Bilateral Near:     Physical Exam Vitals and nursing note reviewed.  Constitutional:      General: He is not in acute distress.    Appearance: Normal appearance. He is not ill-appearing, toxic-appearing or diaphoretic.  Eyes:     General: No scleral icterus. Cardiovascular:     Rate and Rhythm: Normal rate and regular rhythm.     Heart sounds: Normal heart sounds.  Pulmonary:     Effort: Pulmonary effort is normal. No respiratory distress.  Breath sounds: Normal breath sounds. No wheezing or rhonchi.  Abdominal:     General: Abdomen is flat. Bowel sounds are normal.     Palpations: Abdomen is soft.     Tenderness: There is no abdominal tenderness. There is no right CVA tenderness or left CVA tenderness.  Skin:    General: Skin is warm.  Neurological:     Mental Status: He is alert and oriented to person, place, and time.  Psychiatric:        Mood and Affect: Mood normal.        Behavior: Behavior normal.      UC Treatments / Results  Labs (all labs ordered are listed, but only abnormal results are displayed) Labs Reviewed  SYPHILIS: RPR W/REFLEX TO RPR TITER AND TREPONEMAL ANTIBODIES, TRADITIONAL SCREENING AND DIAGNOSIS ALGORITHM  HIV ANTIBODY (ROUTINE TESTING W REFLEX)  CYTOLOGY, (ORAL, ANAL, URETHRAL) ANCILLARY ONLY    EKG   Radiology No results found.  Procedures Procedures (including critical care time)  Medications Ordered in UC Medications - No data to display  Initial Impression / Assessment and Plan / UC Course  I have reviewed the triage vital signs and the nursing notes.  Pertinent labs & imaging results that were available during my care of the  patient were reviewed by me and considered in my medical decision making (see chart for details).      Final Clinical Impressions(s) / UC Diagnoses   Final diagnoses:  Contact with and (suspected) exposure to infections with a predominantly sexual mode of transmission     Discharge Instructions      Will send swab and blood off for STI testing, if you are positive for anything we will contact you    ED Prescriptions   None    PDMP not reviewed this encounter.    [1]  Social History Tobacco Use   Smoking status: Never    Passive exposure: Never   Smokeless tobacco: Never  Vaping Use   Vaping status: Never Used  Substance Use Topics   Alcohol use: No    Alcohol/week: 0.0 standard drinks of alcohol   Drug use: No     Taylor Corean BROCKS, PA-C 05/11/24 1132  "

## 2024-05-12 LAB — CYTOLOGY, (ORAL, ANAL, URETHRAL) ANCILLARY ONLY
Chlamydia: NEGATIVE
Comment: NEGATIVE
Comment: NEGATIVE
Comment: NORMAL
Neisseria Gonorrhea: NEGATIVE
Trichomonas: NEGATIVE

## 2024-05-12 LAB — HIV ANTIBODY (ROUTINE TESTING W REFLEX): HIV Screen 4th Generation wRfx: NONREACTIVE

## 2024-05-12 LAB — SYPHILIS: RPR W/REFLEX TO RPR TITER AND TREPONEMAL ANTIBODIES, TRADITIONAL SCREENING AND DIAGNOSIS ALGORITHM: RPR Ser Ql: NONREACTIVE
